# Patient Record
Sex: Male | Born: 1960 | Race: White | Hispanic: No | Marital: Married | Smoking: Never smoker
Health system: Southern US, Community
[De-identification: ages and names within clinical notes are randomized; demographics above are authoritative.]

## PROBLEM LIST (undated history)

## (undated) DIAGNOSIS — I10 Essential (primary) hypertension: Secondary | ICD-10-CM

## (undated) DIAGNOSIS — M109 Gout, unspecified: Secondary | ICD-10-CM

## (undated) DIAGNOSIS — E119 Type 2 diabetes mellitus without complications: Secondary | ICD-10-CM

## (undated) DIAGNOSIS — E7800 Pure hypercholesterolemia, unspecified: Secondary | ICD-10-CM

---

## 2020-03-23 ENCOUNTER — Encounter: Payer: Self-pay | Admitting: Emergency Medicine

## 2020-03-23 ENCOUNTER — Other Ambulatory Visit: Payer: Self-pay

## 2020-03-23 ENCOUNTER — Ambulatory Visit
Admission: EM | Admit: 2020-03-23 | Discharge: 2020-03-23 | Disposition: A | Discharge status: Home / Self Care | Payer: 59

## 2020-03-23 ENCOUNTER — Ambulatory Visit (INDEPENDENT_AMBULATORY_CARE_PROVIDER_SITE_OTHER): Payer: 59

## 2020-03-23 DIAGNOSIS — M25462 Effusion, left knee: Secondary | ICD-10-CM

## 2020-03-23 DIAGNOSIS — R238 Other skin changes: Secondary | ICD-10-CM | POA: Diagnosis not present

## 2020-03-23 DIAGNOSIS — M25562 Pain in left knee: Secondary | ICD-10-CM | POA: Diagnosis not present

## 2020-03-23 HISTORY — DX: Pure hypercholesterolemia, unspecified: E78.00

## 2020-03-23 HISTORY — DX: Type 2 diabetes mellitus without complications: E11.9

## 2020-03-23 HISTORY — DX: Essential (primary) hypertension: I10

## 2020-03-23 HISTORY — DX: Gout, unspecified: M10.9

## 2020-03-23 NOTE — ED Triage Notes (Signed)
Pt tripped and fell on Monday, PT has bruised area to LT inner thigh that has progressively moved to under leg.  Pt reports no pain unless the area is touch or he is walking up steps.

## 2020-03-23 NOTE — ED Provider Notes (Signed)
[facility]   [id number] 03/23/20 Arrival Time: 1112   Chief Complaint  Patient presents with  . Leg Injury     SUBJECTIVE: History from: patient.  Daniel Moody is a 59 y.o. male who presented to the urgent care with a complaint of left knee pain, swelling and bruising for the past 1 week.  Report he fell while doing some garden work.  He localizes the pain and swelling to the left knee.  He describes the pain as constant and achy.  He has tried OTC medications without relief.  His symptoms are made worse with ROM.  He denies similar symptoms in the past.         ROS: As per HPI.  All other pertinent ROS negative.       Past Medical History:  Diagnosis Date  . Diabetes mellitus without complication (HCC)   . Gout   . High cholesterol   . Hypertension    History reviewed. No pertinent surgical history. Allergies  Allergen Reactions  . Statins Other (See Comments)    Body aches    No current facility-administered medications on file prior to encounter.   Current Outpatient Medications on File Prior to Encounter  Medication Sig Dispense Refill  . allopurinol (ZYLOPRIM) 100 MG tablet Take by mouth daily.    . cetirizine (ZYRTEC) 10 MG tablet Take 10 mg by mouth daily.    . colchicine 0.6 MG tablet Take 0.6 mg by mouth daily.    Marland Kitchen glipiZIDE (GLUCOTROL XL) 2.5 MG 24 hr tablet Take 2.5 mg by mouth daily with breakfast.    . hydrochlorothiazide (HYDRODIURIL) 12.5 MG tablet Take by mouth daily.    . metFORMIN (GLUCOPHAGE) 1000 MG tablet Take 1,000 mg by mouth 2 (two) times daily with a meal.    . quinapril (ACCUPRIL) 40 MG tablet Take 40 mg by mouth at bedtime.     Social History   Socioeconomic History  . Marital status: Married    Spouse name: Not on file  . Number of children: Not on file  . Years of education: Not on file  . Highest education level: Not on file  Occupational History  . Not on file  Tobacco Use  . Smoking status: Never Smoker  .  Smokeless tobacco: Never Used  Substance and Sexual Activity  . Alcohol use: Yes    Comment: occ  . Drug use: Never  . Sexual activity: Not on file  Other Topics Concern  . Not on file  Social History Narrative  . Not on file   Social Determinants of Health   Financial Resource Strain:   . Difficulty of Paying Living Expenses:   Food Insecurity:   . Worried About Programme researcher, broadcasting/film/video in the Last Year:   . Barista in the Last Year:   Transportation Needs:   . Freight forwarder (Medical):   Marland Kitchen Lack of Transportation (Non-Medical):   Physical Activity:   . Days of Exercise per Week:   . Minutes of Exercise per Session:   Stress:   . Feeling of Stress :   Social Connections:   . Frequency of Communication with Friends and Family:   . Frequency of Social Gatherings with Friends and Family:   . Attends Religious Services:   . Active Member of Clubs or Organizations:   . Attends Banker Meetings:   Marland Kitchen Marital Status:   Intimate Partner Violence:   . Fear of Current or Ex-Partner:   .  Emotionally Abused:   Marland Kitchen Physically Abused:   . Sexually Abused:    Family History  Problem Relation Age of Onset  . Hypertension Mother   . Diabetes Mother     OBJECTIVE:  Vitals:   03/23/20 1127 03/23/20 1128  BP: (!) 146/69   Pulse: 72   Resp: 17   Temp: 98 F (36.7 C)   TempSrc: Oral   SpO2: 96%   Weight:  215 lb (97.5 kg)  Height:  5\' 11"  (1.803 m)     Physical Exam Vitals and nursing note reviewed.  Constitutional:      General: He is not in acute distress.    Appearance: Normal appearance. He is normal weight. He is not ill-appearing, toxic-appearing or diaphoretic.  Cardiovascular:     Rate and Rhythm: Normal rate and regular rhythm.     Pulses: Normal pulses.     Heart sounds: Normal heart sounds. No murmur heard.  No friction rub. No gallop.   Pulmonary:     Effort: Pulmonary effort is normal. No respiratory distress.     Breath sounds:  Normal breath sounds. No stridor. No wheezing, rhonchi or rales.  Chest:     Chest wall: No tenderness.  Musculoskeletal:        General: Swelling and tenderness present.     Right knee: Normal.     Left knee: Swelling present. Tenderness present.     Comments: Patient is able to bear weight and ambulate with pain.  Surface trauma, ecchymosis and swelling present.  The left knee is with obvious deformity when compared to the right knee.  Unable to deep knee pain.  No quadricep tenderness.  Limited range of motion due to pain.  Negative anterior drawer test.  Neurovascular status intact.  Neurological:     Mental Status: He is alert.     LABS:  No results found for this or any previous visit (from the past 24 hour(s)).   ASSESSMENT & PLAN:  1. Acute pain of left knee     No orders of the defined types were placed in this encounter.  Patient is stable at discharge.  X-ray is negative for bony abnormality including fracture or dislocation.  I have reviewed the x-ray myself and the radiologist interpretation.  I am in agreement with the radiologist interpretation.    Discharge Instructions Follow RICE instruction that is attached Take OTC Tylenol as needed for pain management Work note was given Follow-up with PCP/orthopedic Return or go to ED for worsening of symptoms  Reviewed expectations re: course of current medical issues. Questions answered. Outlined signs and symptoms indicating need for more acute intervention. Patient verbalized understanding. After Visit Summary given.      Note: This document was prepared using Dragon voice recognition software and may include unintentional dictation errors.     , FNP 03/23/20 1252

## 2020-03-23 NOTE — Discharge Instructions (Addendum)
Follow RICE instruction that is attached Take OTC Tylenol as needed for pain management Work note was given Follow-up with PCP/orthopedic Return or go to ED for worsening of symptoms

## 2020-12-06 ENCOUNTER — Other Ambulatory Visit: Payer: Self-pay

## 2020-12-06 ENCOUNTER — Emergency Department (HOSPITAL_COMMUNITY)
Admission: EM | Admit: 2020-12-06 | Discharge: 2020-12-07 | Disposition: A | Discharge status: Home / Self Care | Payer: Worker's Compensation | Attending: Emergency Medicine | Admitting: Emergency Medicine

## 2020-12-06 ENCOUNTER — Encounter (HOSPITAL_COMMUNITY): Payer: Self-pay

## 2020-12-06 ENCOUNTER — Emergency Department (HOSPITAL_COMMUNITY): Payer: Worker's Compensation

## 2020-12-06 DIAGNOSIS — W228XXA Striking against or struck by other objects, initial encounter: Secondary | ICD-10-CM | POA: Insufficient documentation

## 2020-12-06 DIAGNOSIS — Z79899 Other long term (current) drug therapy: Secondary | ICD-10-CM | POA: Insufficient documentation

## 2020-12-06 DIAGNOSIS — S6991XA Unspecified injury of right wrist, hand and finger(s), initial encounter: Secondary | ICD-10-CM | POA: Diagnosis present

## 2020-12-06 DIAGNOSIS — S61312A Laceration without foreign body of right middle finger with damage to nail, initial encounter: Secondary | ICD-10-CM | POA: Diagnosis not present

## 2020-12-06 DIAGNOSIS — E119 Type 2 diabetes mellitus without complications: Secondary | ICD-10-CM | POA: Diagnosis not present

## 2020-12-06 DIAGNOSIS — S61011A Laceration without foreign body of right thumb without damage to nail, initial encounter: Secondary | ICD-10-CM | POA: Diagnosis not present

## 2020-12-06 DIAGNOSIS — Z7984 Long term (current) use of oral hypoglycemic drugs: Secondary | ICD-10-CM | POA: Diagnosis not present

## 2020-12-06 DIAGNOSIS — I10 Essential (primary) hypertension: Secondary | ICD-10-CM | POA: Diagnosis not present

## 2020-12-06 DIAGNOSIS — S61212A Laceration without foreign body of right middle finger without damage to nail, initial encounter: Secondary | ICD-10-CM

## 2020-12-06 MED ORDER — LIDOCAINE HCL (PF) 1 % IJ SOLN
30.0000 mL | Freq: Once | INTRAMUSCULAR | Status: DC
  Filled 2020-12-06: qty 30

## 2020-12-06 MED ORDER — ACETAMINOPHEN 500 MG PO TABS
1000.0000 mg | ORAL_TABLET | Freq: Once | ORAL | Status: AC
  Administered 2020-12-06: 1000 mg via ORAL
  Filled 2020-12-06: qty 2

## 2020-12-06 NOTE — ED Notes (Signed)
X-ray at bedside

## 2020-12-06 NOTE — ED Provider Notes (Signed)
[facility] EMERGENCY DEPARTMENT Provider Note   CSN: [id number] Arrival date & time: 12/06/20  2225     History Chief Complaint  Patient presents with  . Laceration    Patient arrived with lacerations to thumb and middle finger on right hand. Work related injury.     Daniel Moody is a 60 y.o. male.  HPI Patient is a 60 year old male presented today for lacerations to his right hand that occurred approximately 2 hours before arrival in the ER.  He states that he was putting his hand into a metal contraption that contained a blunt edged fan and states that the fan hit his hand into the metal cage surrounding it.  He states that immediately withdrew his hand.  He states that he still able to move his fingers and has good sensation but did have bleeding and was concerned that he had a fracture in his fingers.  He is right-hand dominant.  Does have a history of diabetes high cholesterol hypertension no other immunocompromising disease besides diabetes.  He states he is up-to-date on his tetanus within the past 4 years and there is some evidence in his EMR that he has had a tetanus shot in the past year.    Past Medical History:  Diagnosis Date  . Diabetes mellitus without complication (HCC)   . Gout   . High cholesterol   . Hypertension     There are no problems to display for this patient.   History reviewed. No pertinent surgical history.     Family History  Problem Relation Age of Onset  . Hypertension Mother   . Diabetes Mother     Social History   Tobacco Use  . Smoking status: Never Smoker  . Smokeless tobacco: Never Used  Substance Use Topics  . Alcohol use: Yes    Comment: occ  . Drug use: Never    Home Medications Prior to Admission medications   Medication Sig Start Date End Date Taking? Authorizing Provider  allopurinol (ZYLOPRIM) 100 MG tablet Take by mouth daily.    [provider]  cetirizine (ZYRTEC) 10 MG tablet  Take 10 mg by mouth daily.    [provider]  colchicine 0.6 MG tablet Take 0.6 mg by mouth daily.    [provider]  glipiZIDE (GLUCOTROL XL) 2.5 MG 24 hr tablet Take 2.5 mg by mouth daily with breakfast.    [provider]  hydrochlorothiazide (HYDRODIURIL) 12.5 MG tablet Take by mouth daily.    [provider]  metFORMIN (GLUCOPHAGE) 1000 MG tablet Take 1,000 mg by mouth 2 (two) times daily with a meal.    [provider]  quinapril (ACCUPRIL) 40 MG tablet Take 40 mg by mouth at bedtime.    [provider]    Allergies    Statins  Review of Systems   Review of Systems  Constitutional: Negative for chills and fever.  HENT: Negative for congestion.   Respiratory: Negative for shortness of breath.   Cardiovascular: Negative for chest pain.  Gastrointestinal: Negative for abdominal pain.  Musculoskeletal: Negative for neck pain.  Skin: Positive for wound.    Physical Exam Updated Vital Signs BP 125/64   Pulse 81   Temp 98.1 F (36.7 C) (Oral)   Resp 18   Ht 5\' 11"  (1.803 m)   Wt 93.9 kg   SpO2 99%   BMI 28.87 kg/m   Physical Exam Vitals and nursing note reviewed.  Constitutional:  General: He is not in acute distress.    Appearance: Normal appearance. He is not ill-appearing.  HENT:     Head: Normocephalic and atraumatic.  Eyes:     General: No scleral icterus.       Right eye: No discharge.        Left eye: No discharge.     Conjunctiva/sclera: Conjunctivae normal.  Pulmonary:     Effort: Pulmonary effort is normal.     Breath sounds: No stridor.  Musculoskeletal:     Comments: Patient able to flex and extend all fingers of the right hand at the proximal interphalangeal, distal phalangeal and metacarpal phalangeal joint.     Skin:    General: Skin is warm and dry.     Comments: e first laceration is to the right thumb at the dorsal aspect of the finger that does not cross the joint line it is located  across the m proximal phalanx and is approximately 3 cm long.  Obliquely oriented.  The second laceration is the volar aspect of the right middle finger across the middle phalanx is approximately 3 cmlong.  There are other small nongaping lacerations to the hand that are nonbleeding.  Neurological:     Mental Status: He is alert and oriented to person, place, and time. Mental status is at baseline.     Comments: Sensation intact in all fingertips.     ED Results / Procedures / Treatments   Labs (all labs ordered are listed, but only abnormal results are displayed) Labs Reviewed - No data to display  EKG None  Radiology DG Hand Complete Right  Result Date: 12/06/2020 CLINICAL DATA:  Status post trauma. EXAM: RIGHT HAND - COMPLETE 3+ VIEW COMPARISON:  None. FINDINGS: There is no evidence of fracture or dislocation. There is no evidence of arthropathy or other focal bone abnormality. Soft tissues are unremarkable. IMPRESSION: Negative. Electronically Signed   By: Aram Candela M.D.   On: 12/06/2020 23:21    Procedures .Marland KitchenLaceration Repair  Date/Time: 12/07/2020 12:53 AM Performed by: Gailen Shelter, PA Authorized by: Gailen Shelter, PA   Consent:    Consent obtained:  Verbal   Consent given by:  Patient   Risks discussed:  Infection, need for additional repair, pain, poor cosmetic result and poor wound healing   Alternatives discussed:  No treatment and delayed treatment Universal protocol:    Procedure explained and questions answered to patient or proxy's satisfaction: yes     Relevant documents present and verified: yes     Test results available: yes     Imaging studies available: yes     Required blood products, implants, devices, and special equipment available: yes     Site/side marked: yes     Immediately prior to procedure, a time out was called: yes     Patient identity confirmed:  Verbally with patient Anesthesia:    Anesthesia method:  Local  infiltration Laceration details:    Location:  Finger   Finger location:  R thumb   Length (cm):  3 Exploration:    Limited defect created (wound extended): no     Hemostasis achieved with:  Direct pressure   Imaging obtained: x-ray     Imaging outcome: foreign body not noted     Wound extent: no foreign bodies/material noted and no tendon damage noted     Contaminated: no   Treatment:    Area cleansed with:  Saline   Amount of cleaning:  Standard  Irrigation solution:  Sterile saline   Irrigation method:  Pressure wash   Visualized foreign bodies/material removed: no   Skin repair:    Repair method:  Sutures   Suture size:  5-0   Suture material:  Prolene   Suture technique:  Simple interrupted   Number of sutures:  6 Approximation:    Approximation:  Close Repair type:    Repair type:  Intermediate Post-procedure details:    Dressing:  Antibiotic ointment and non-adherent dressing   Procedure completion:  Tolerated well, no immediate complications .Marland Kitchen.Laceration Repair  Date/Time: 12/07/2020 12:54 AM Performed by: Gailen ShelterFondaw, Harrington Jobe S, PA Authorized by: Gailen ShelterFondaw, Kaelee Pfeffer S, PA   Consent:    Consent obtained:  Verbal   Consent given by:  Patient   Risks discussed:  Infection, need for additional repair, pain, poor cosmetic result and poor wound healing   Alternatives discussed:  No treatment and delayed treatment Universal protocol:    Procedure explained and questions answered to patient or proxy's satisfaction: yes     Relevant documents present and verified: yes     Test results available: yes     Imaging studies available: yes     Required blood products, implants, devices, and special equipment available: yes     Site/side marked: yes     Immediately prior to procedure, a time out was called: yes     Patient identity confirmed:  Verbally with patient Anesthesia:    Anesthesia method:  Local infiltration Laceration details:    Location:  Finger   Finger location:  R  long finger   Length (cm):  3 Exploration:    Hemostasis achieved with:  Direct pressure   Imaging obtained: x-ray     Imaging outcome: foreign body not noted     Wound extent: no foreign bodies/material noted and no tendon damage noted     Contaminated: no   Treatment:    Area cleansed with:  Saline   Amount of cleaning:  Standard   Irrigation solution:  Sterile saline   Irrigation method:  Pressure wash   Visualized foreign bodies/material removed: no   Skin repair:    Repair method:  Sutures   Suture size:  5-0   Suture material:  Prolene   Suture technique:  Simple interrupted   Number of sutures:  2 Approximation:    Approximation:  Close Repair type:    Repair type:  Simple Post-procedure details:    Dressing:  Antibiotic ointment and non-adherent dressing   Procedure completion:  Tolerated well, no immediate complications     Medications Ordered in ED Medications  lidocaine (PF) (XYLOCAINE) 1 % injection 30 mL (has no administration in time range)  acetaminophen (TYLENOL) tablet 1,000 mg (1,000 mg Oral Given 12/06/20 2311)    ED Course  I have reviewed the triage vital signs and the nursing notes.  Pertinent labs & imaging results that were available during my care of the patient were reviewed by me and considered in my medical decision making (see chart for details).    MDM Rules/Calculators/A&P                          Pressure irrigation performed. Wound explored and base of wound visualized in a bloodless field without evidence of foreign body.  Laceration occurred < 8 hours prior to repair which was well tolerated.  Tdap UTD.  Pt has diabetes but no other comorbidities to effect normal wound healing. Pt  discharged  without antibiotics.  Discussed suture home care with patient and answered questions. Pt to follow-up for wound check and suture removal in 7 days; they are to return to the ED sooner for signs of infection. Pt is hemodynamically stable with no  complaints prior to dc.   Patient was seen with PA student which patient agreed to.  Final Clinical Impression(s) / ED Diagnoses Final diagnoses:  Laceration of right thumb without foreign body without damage to nail, initial encounter  Laceration of right middle finger without foreign body without damage to nail, initial encounter    Rx / DC Orders ED Discharge Orders    None       Gailen Shelter, [place] 12/07/20 1660    Tegeler, Canary Brim, MD 12/08/20 0000

## 2020-12-06 NOTE — ED Notes (Signed)
Pa  at bedside. 

## 2020-12-07 NOTE — Discharge Instructions (Signed)
Sutured repair Keep the laceration site dry for the next 24 hours and leave the dressing in place. After 24 hours you may remove the dressing and gently clean the laceration site with antibacterial soap and warm water. Do not scrub the area. Do not soak the area and water for long periods of time. Don't use hydrogen peroxide, iodine-based solutions, or alcohol, which can slow healing, and will probably be painful! Apply topical bacitracin 1-2 times per day for the next 3-5 days. Return to the emergency department in 7-10 days for removal of the sutures.  You should return sooner for any signs of infection which would include increased redness around the wound, increased swelling, new drainage of yellow pus.   Please use Tylenol or ibuprofen for pain.  You may use 600 mg ibuprofen every 6 hours or 1000 mg of Tylenol every 6 hours.  You may choose to alternate between the 2.  This would be most effective.  Not to exceed 4 g of Tylenol within 24 hours.  Not to exceed 3200 mg ibuprofen 24 hours.  Please ice and elevate your hands you will have some swelling because of the traumatic injury.  Please follow-up with your primary care doctor for reevaluation in 2 to 3 days.  You may always return to the ER for wound check.

## 2020-12-07 NOTE — Progress Notes (Signed)
Orthopedic Tech Progress Note Patient Details:  Daniel Moody 30-Jan-1961 [id number]  Ortho Devices Type of Ortho Device: Finger splint Ortho Device/Splint Location: Right Thumb & Middle Finger Ortho Device/Splint Interventions: Application   Post Interventions Patient Tolerated: Well Instructions Provided: Care of device   Daniel Moody 12/07/2020, 1:30 AM

## 2020-12-25 ENCOUNTER — Ambulatory Visit
Admission: RE | Admit: 2020-12-25 | Discharge: 2020-12-25 | Disposition: A | Discharge status: Home / Self Care | Payer: Worker's Compensation | Source: Ambulatory Visit | Attending: Emergency Medicine | Admitting: Emergency Medicine

## 2020-12-25 ENCOUNTER — Other Ambulatory Visit: Payer: Self-pay | Admitting: Emergency Medicine

## 2020-12-25 DIAGNOSIS — T148XXA Other injury of unspecified body region, initial encounter: Secondary | ICD-10-CM

## 2020-12-25 DIAGNOSIS — R609 Edema, unspecified: Secondary | ICD-10-CM

## 2021-07-08 IMAGING — DX DG HAND COMPLETE 3+V*R*
3 series · 3 of 3 positions shown · non-contrast
Comparison: None.

CLINICAL DATA: Status post trauma.

EXAM:
RIGHT HAND - COMPLETE 3+ VIEW

[hand pa]
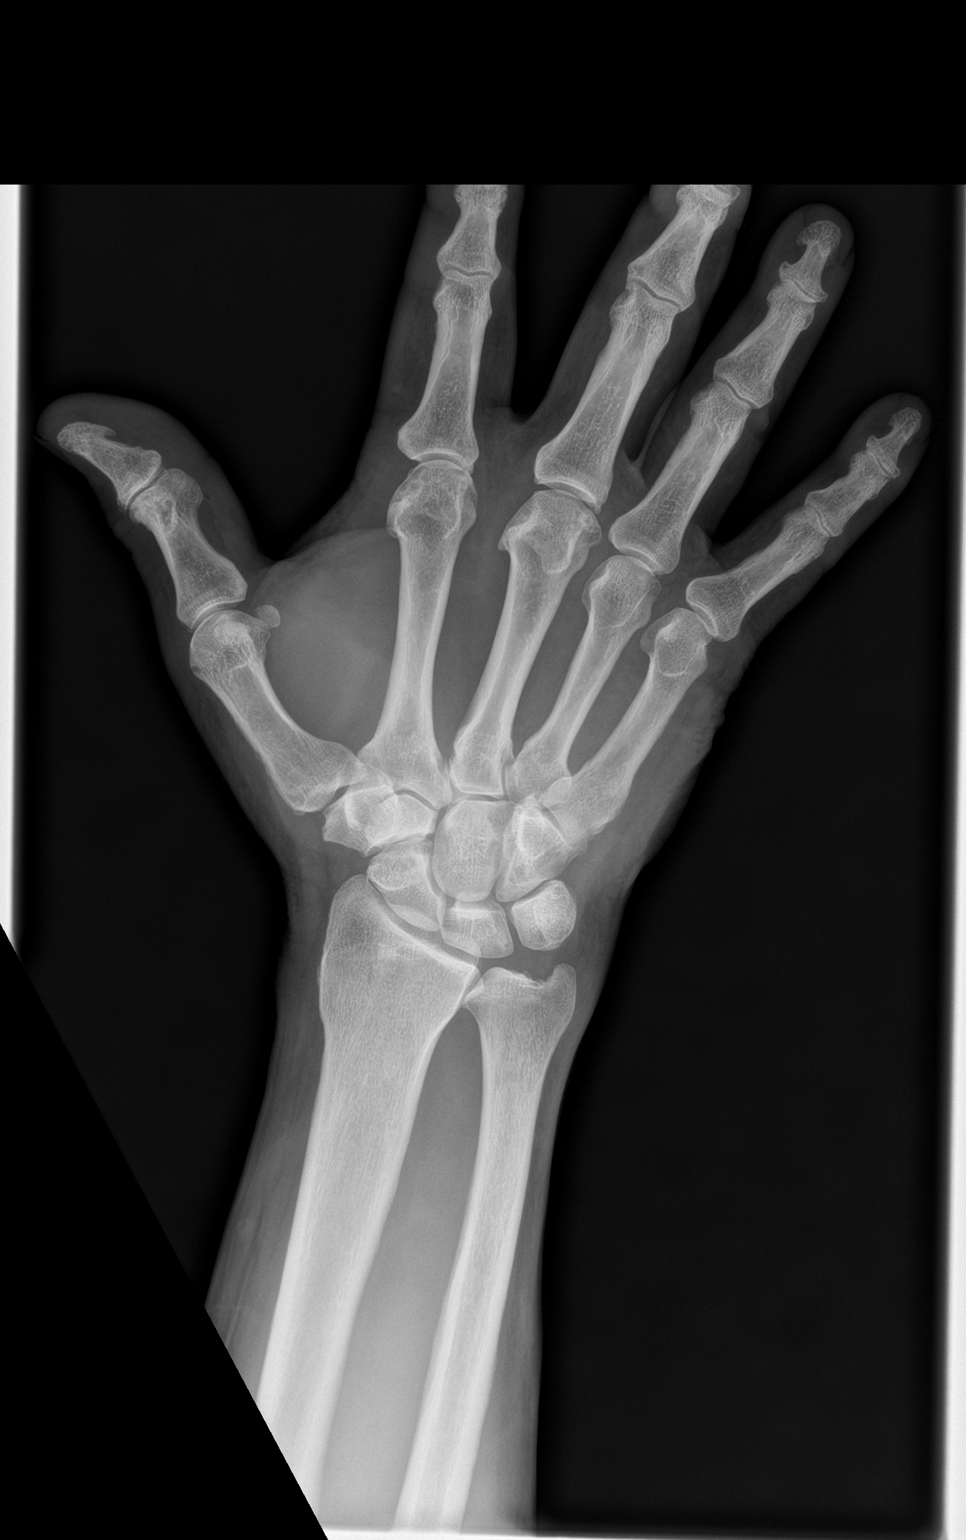

[hand obl]
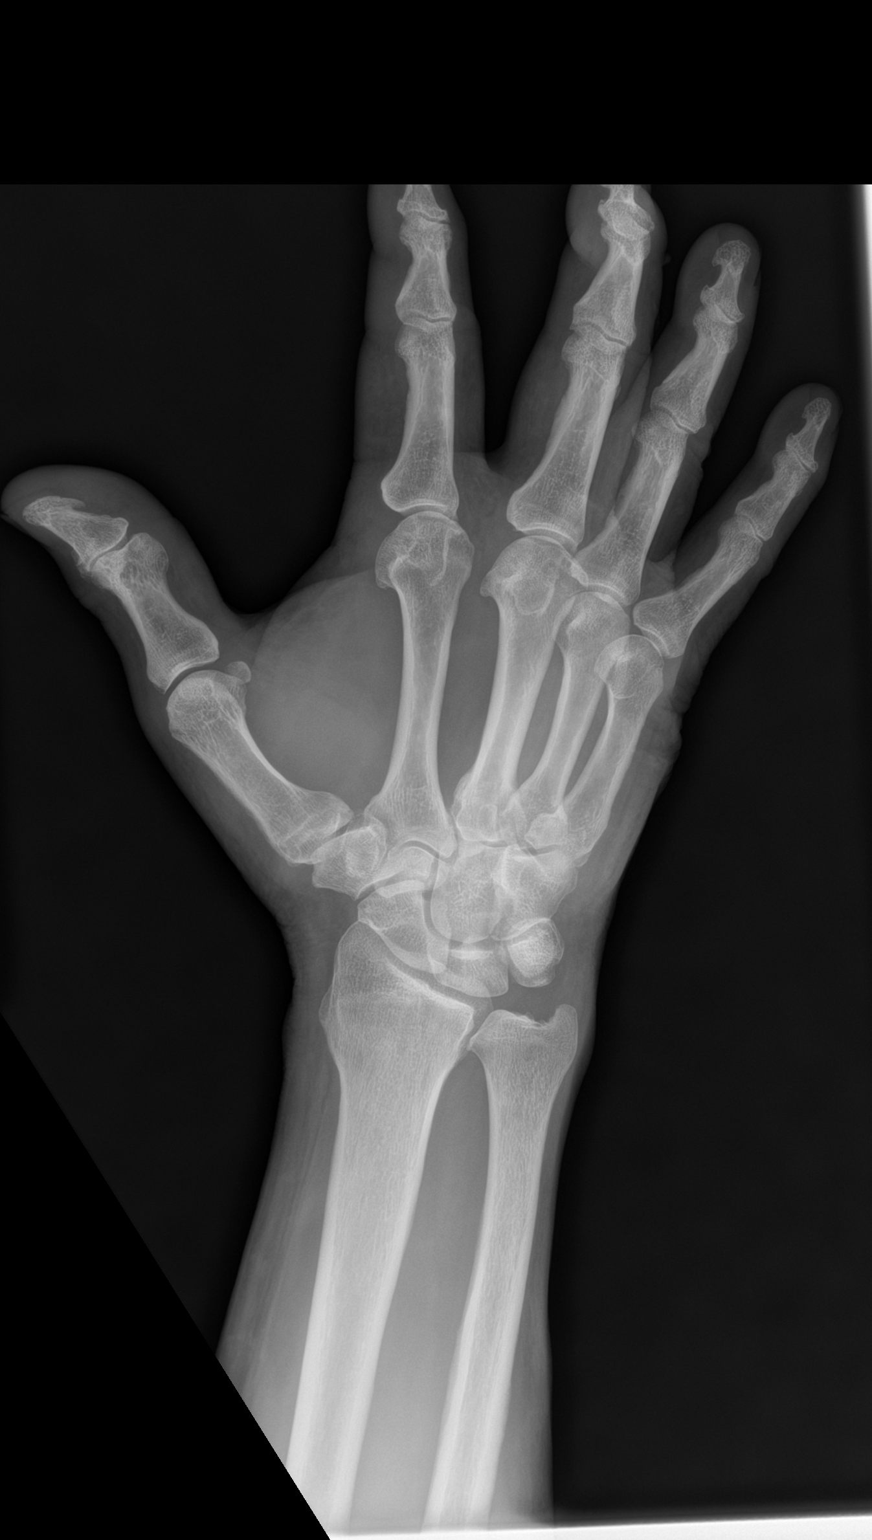

[hand lat]
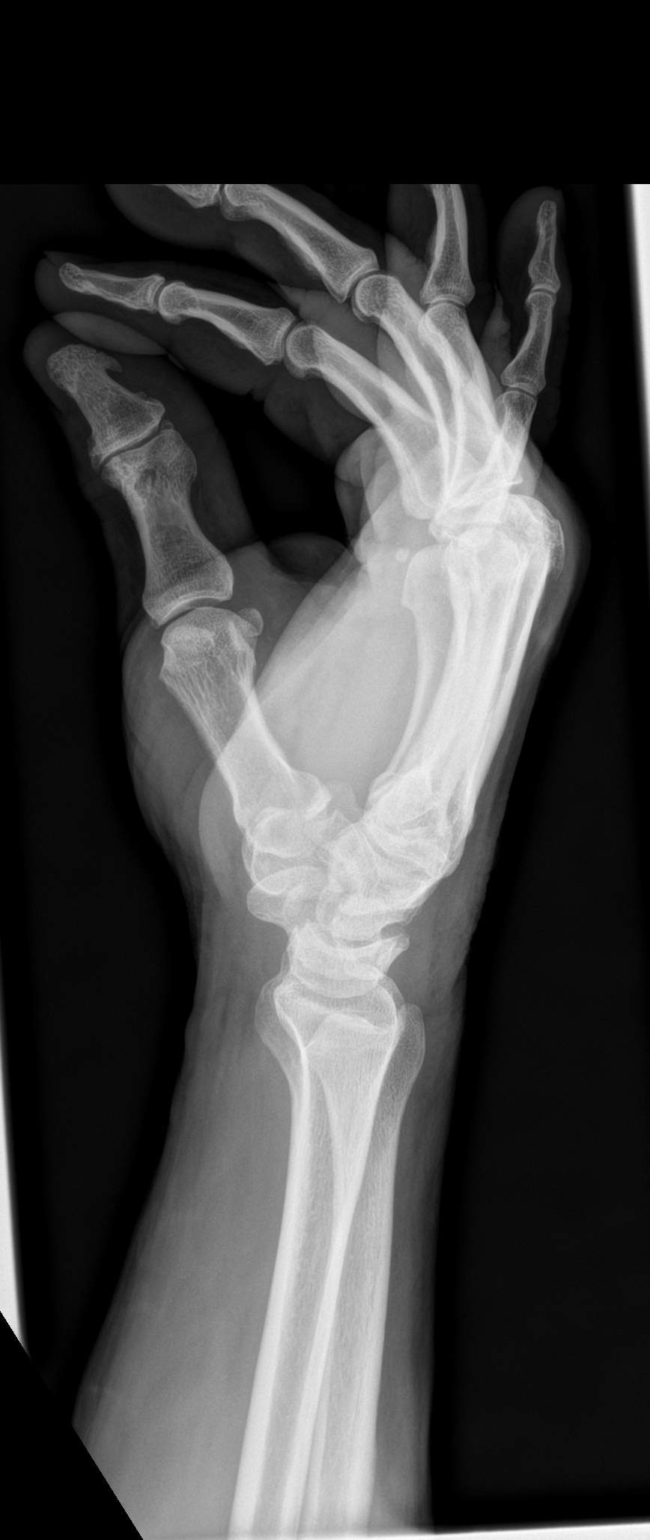

[3 of 3 positions shown; findings below may reference images not displayed]

FINDINGS: There is no evidence of fracture or dislocation. There is no
evidence of arthropathy or other focal bone abnormality. Soft
tissues are unremarkable.
IMPRESSION: Negative.

## 2021-07-27 IMAGING — CR DG HAND COMPLETE 3+V*R*
3 series · 3 of 3 positions shown · non-contrast
Comparison: 12/06/2020.

CLINICAL DATA: 59-year-old male status post crush injury to the
hand last month with continued pain and swelling 1st 3 fingers.

EXAM:
RIGHT HAND - COMPLETE 3+ VIEW

[x hand pa right]
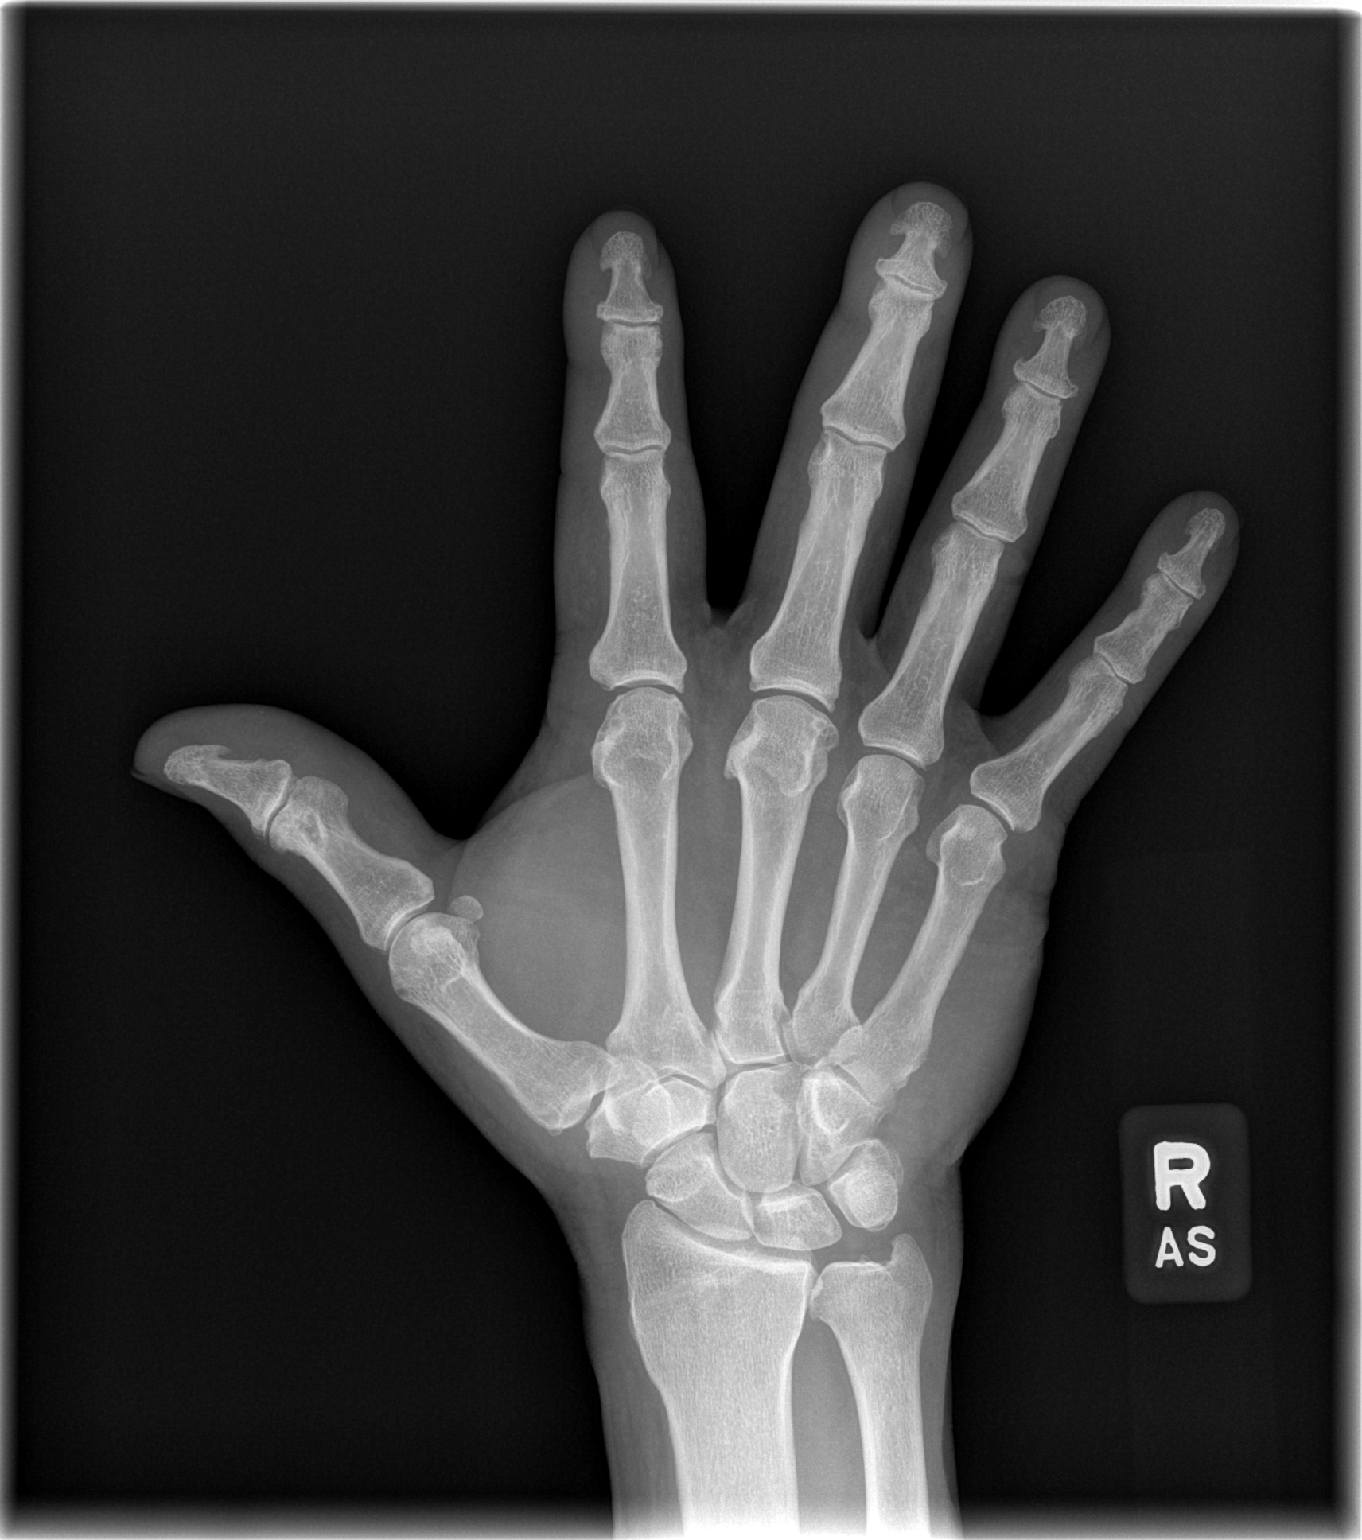

[x hand oblique right *]
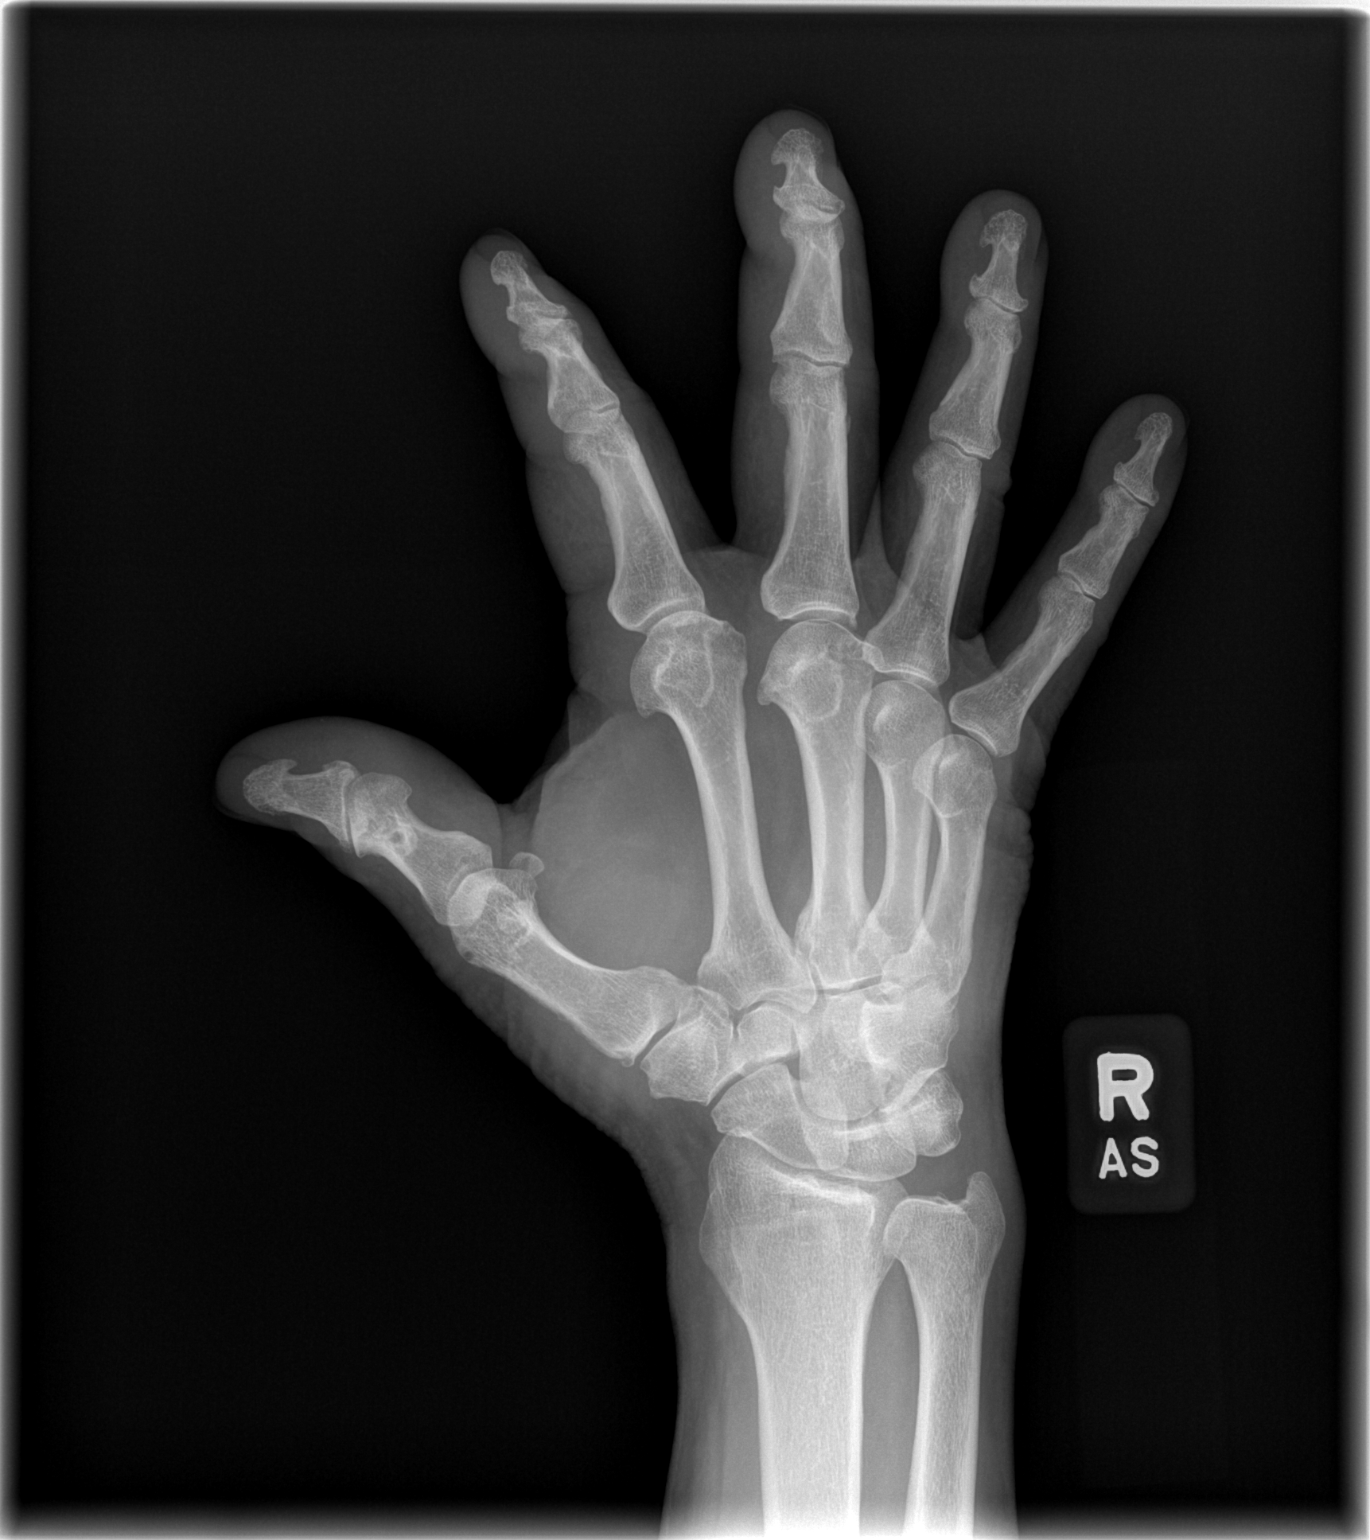

[x hand lat right]
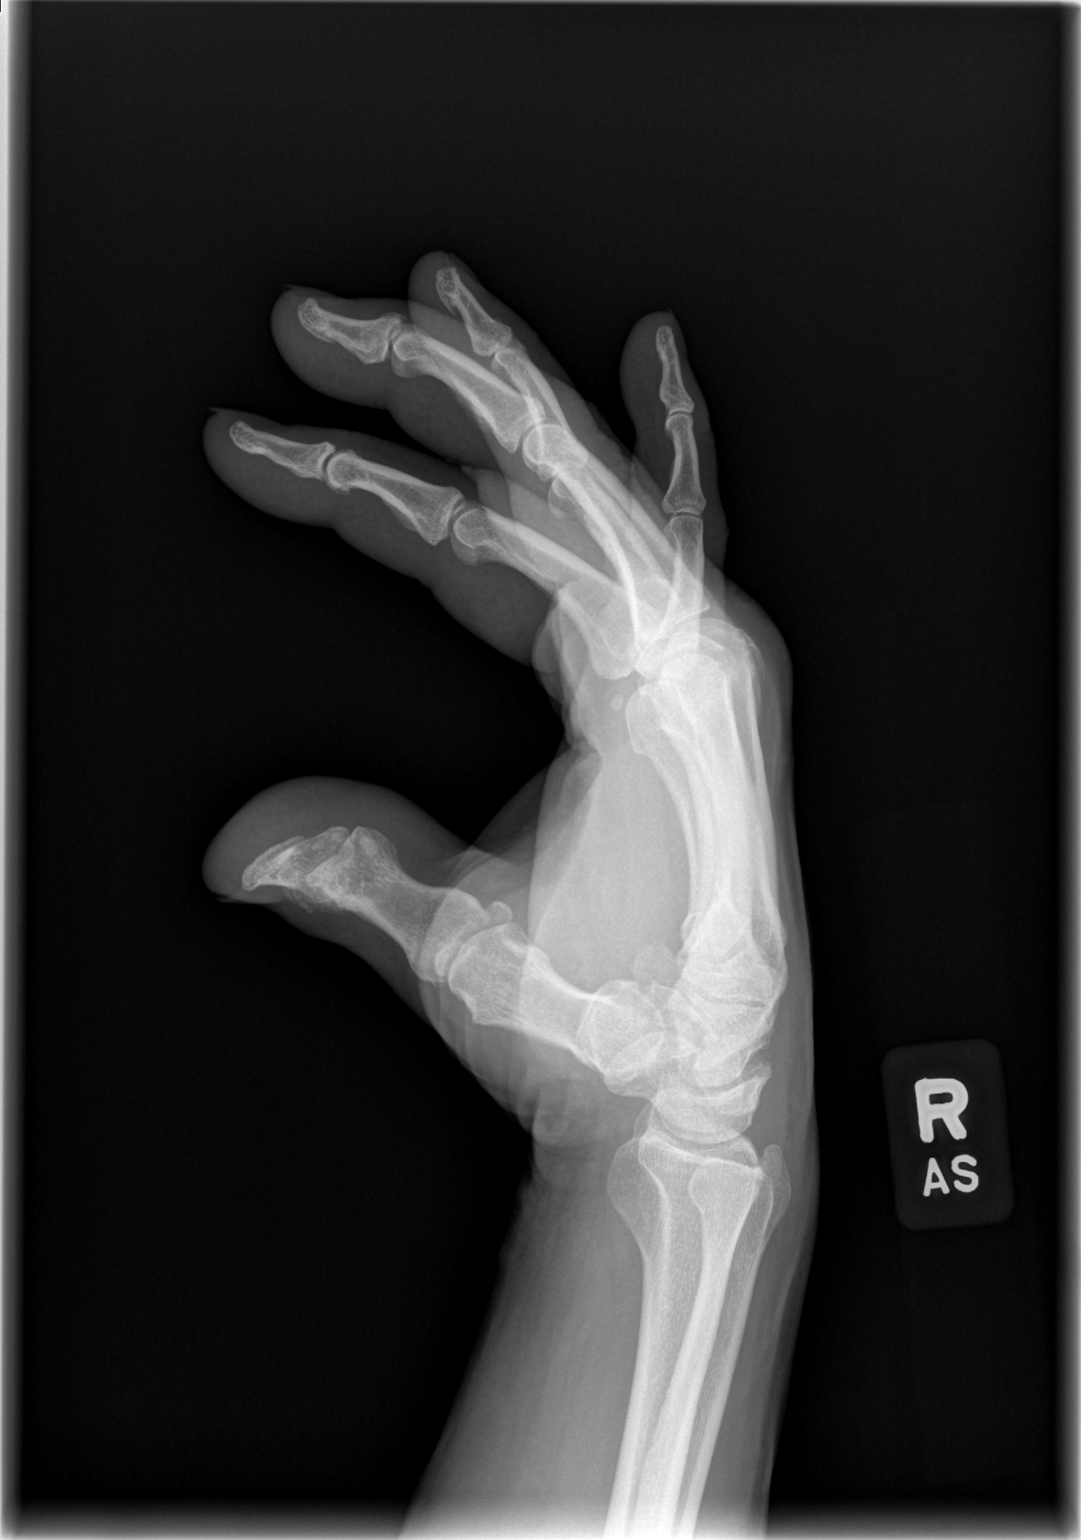

[3 of 3 positions shown; findings below may reference images not displayed]

FINDINGS: Bone mineralization is within normal limits. Distal radius and ulna,
carpal bones, metacarpals and phalanges appear stable and intact.
Hooked appearance of the metacarpal heads again noted, but joint
spaces appear within normal limits for age throughout the hand. No
discrete soft tissue abnormality.
IMPRESSION: Stable and negative for age radiographic appearance of the right
hand.
